# Patient Record
Sex: Male | Born: 1970 | Race: Black or African American | Hispanic: No | Smoking: Former smoker
Health system: Southern US, Community
[De-identification: ages and names within clinical notes are randomized; demographics above are authoritative.]

---

## 1998-06-17 ENCOUNTER — Emergency Department (HOSPITAL_COMMUNITY): Admission: EM | Admit: 1998-06-17 | Discharge: 1998-06-17 | Payer: Self-pay | Admitting: Emergency Medicine

## 2002-07-05 ENCOUNTER — Encounter: Admission: RE | Admit: 2002-07-05 | Discharge: 2002-07-05 | Payer: Self-pay | Admitting: Family Medicine

## 2002-07-05 ENCOUNTER — Encounter: Payer: Self-pay | Admitting: Family Medicine

## 2003-02-10 ENCOUNTER — Encounter: Payer: Self-pay | Admitting: Family Medicine

## 2003-02-10 ENCOUNTER — Encounter: Admission: RE | Admit: 2003-02-10 | Discharge: 2003-02-10 | Payer: Self-pay | Admitting: Family Medicine

## 2003-04-21 ENCOUNTER — Emergency Department (HOSPITAL_COMMUNITY): Admission: AD | Admit: 2003-04-21 | Discharge: 2003-04-21 | Payer: Self-pay | Admitting: Family Medicine

## 2003-05-02 ENCOUNTER — Emergency Department (HOSPITAL_COMMUNITY): Admission: AD | Admit: 2003-05-02 | Discharge: 2003-05-02 | Payer: Self-pay | Admitting: Family Medicine

## 2003-11-11 ENCOUNTER — Encounter: Admission: RE | Admit: 2003-11-11 | Discharge: 2003-11-11 | Payer: Self-pay | Admitting: Family Medicine

## 2004-10-27 IMAGING — CR DG ANKLE COMPLETE 3+V*R*
3 series · 3 of 3 positions shown · non-contrast
Comparison: none

CLINICAL DATA: Ankle pain following fall three days ago. 
 RIGHT ANKLE, THREE VIEWS
 There is generalized soft tissue swelling surrounding the ankle and a probable ankle joint effusion. No acute fracture or dislocation is demonstrated. 
 IMPRESSION
 No evidence of acute fracture or dislocation.

[view not recorded (1 of 3)]
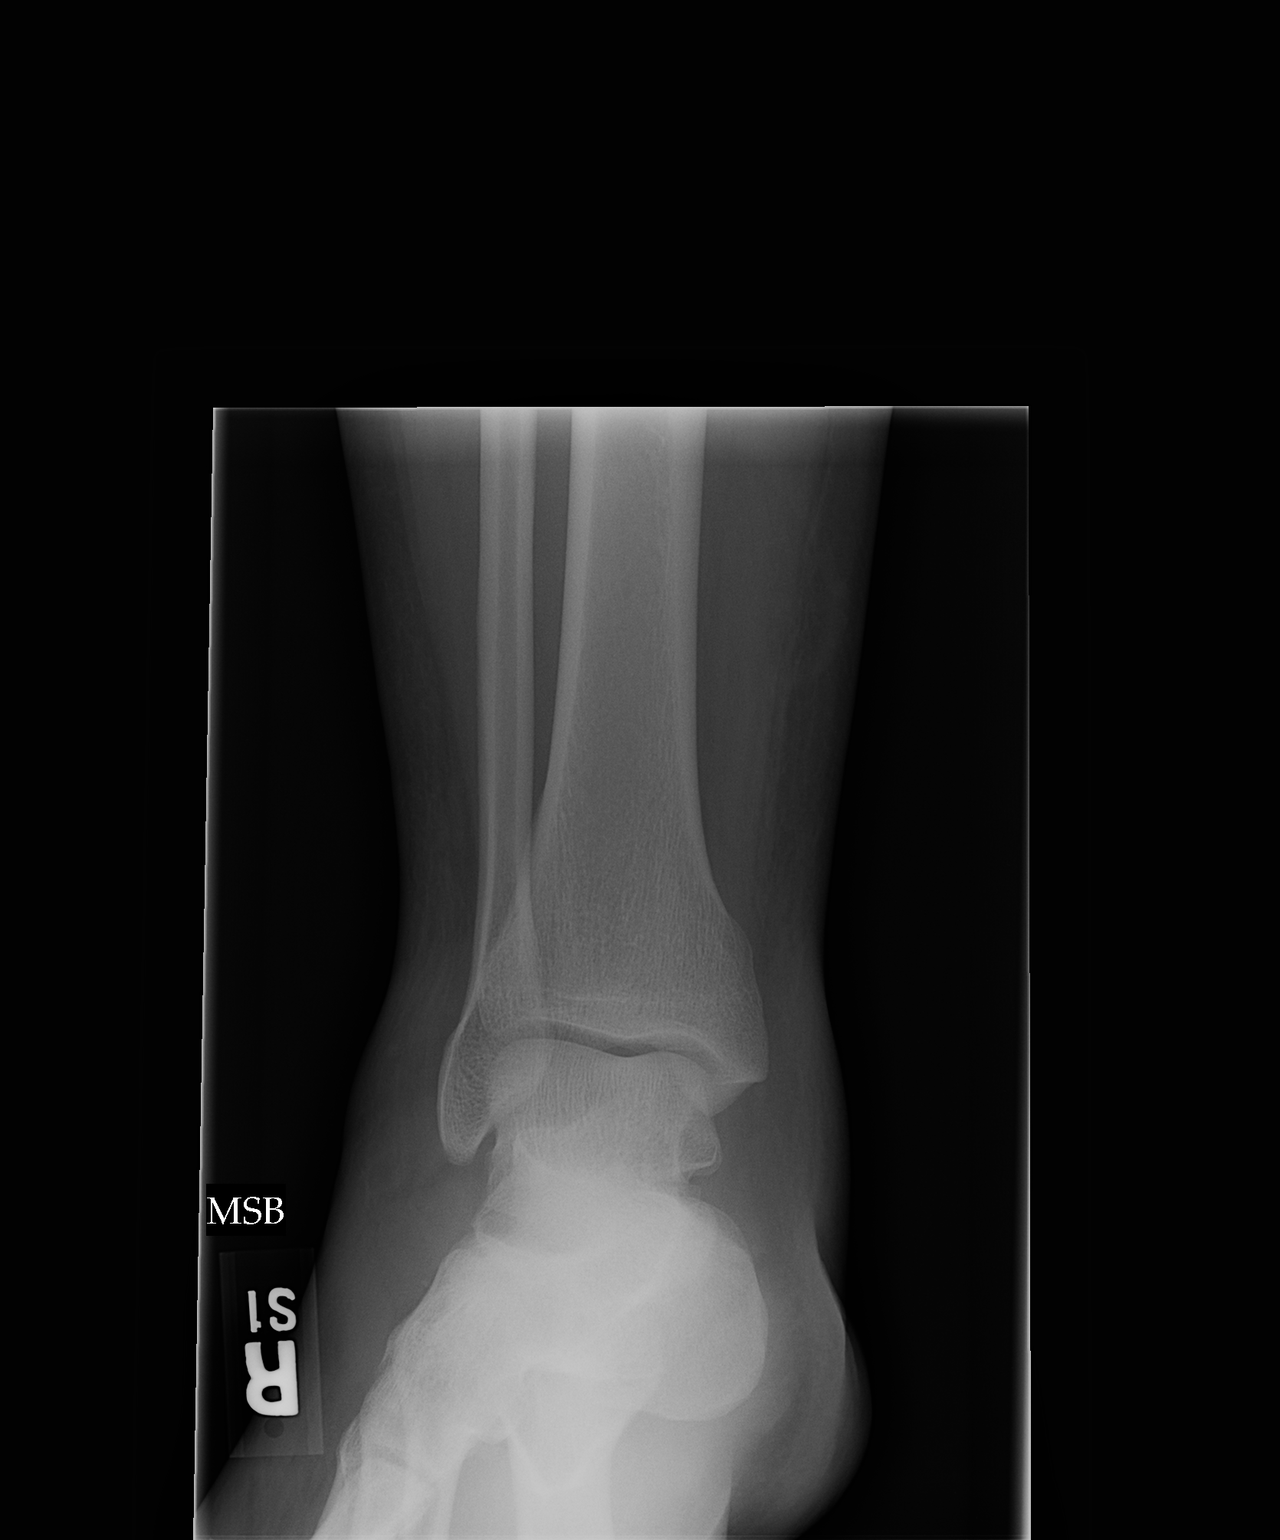

[view not recorded (2 of 3)]
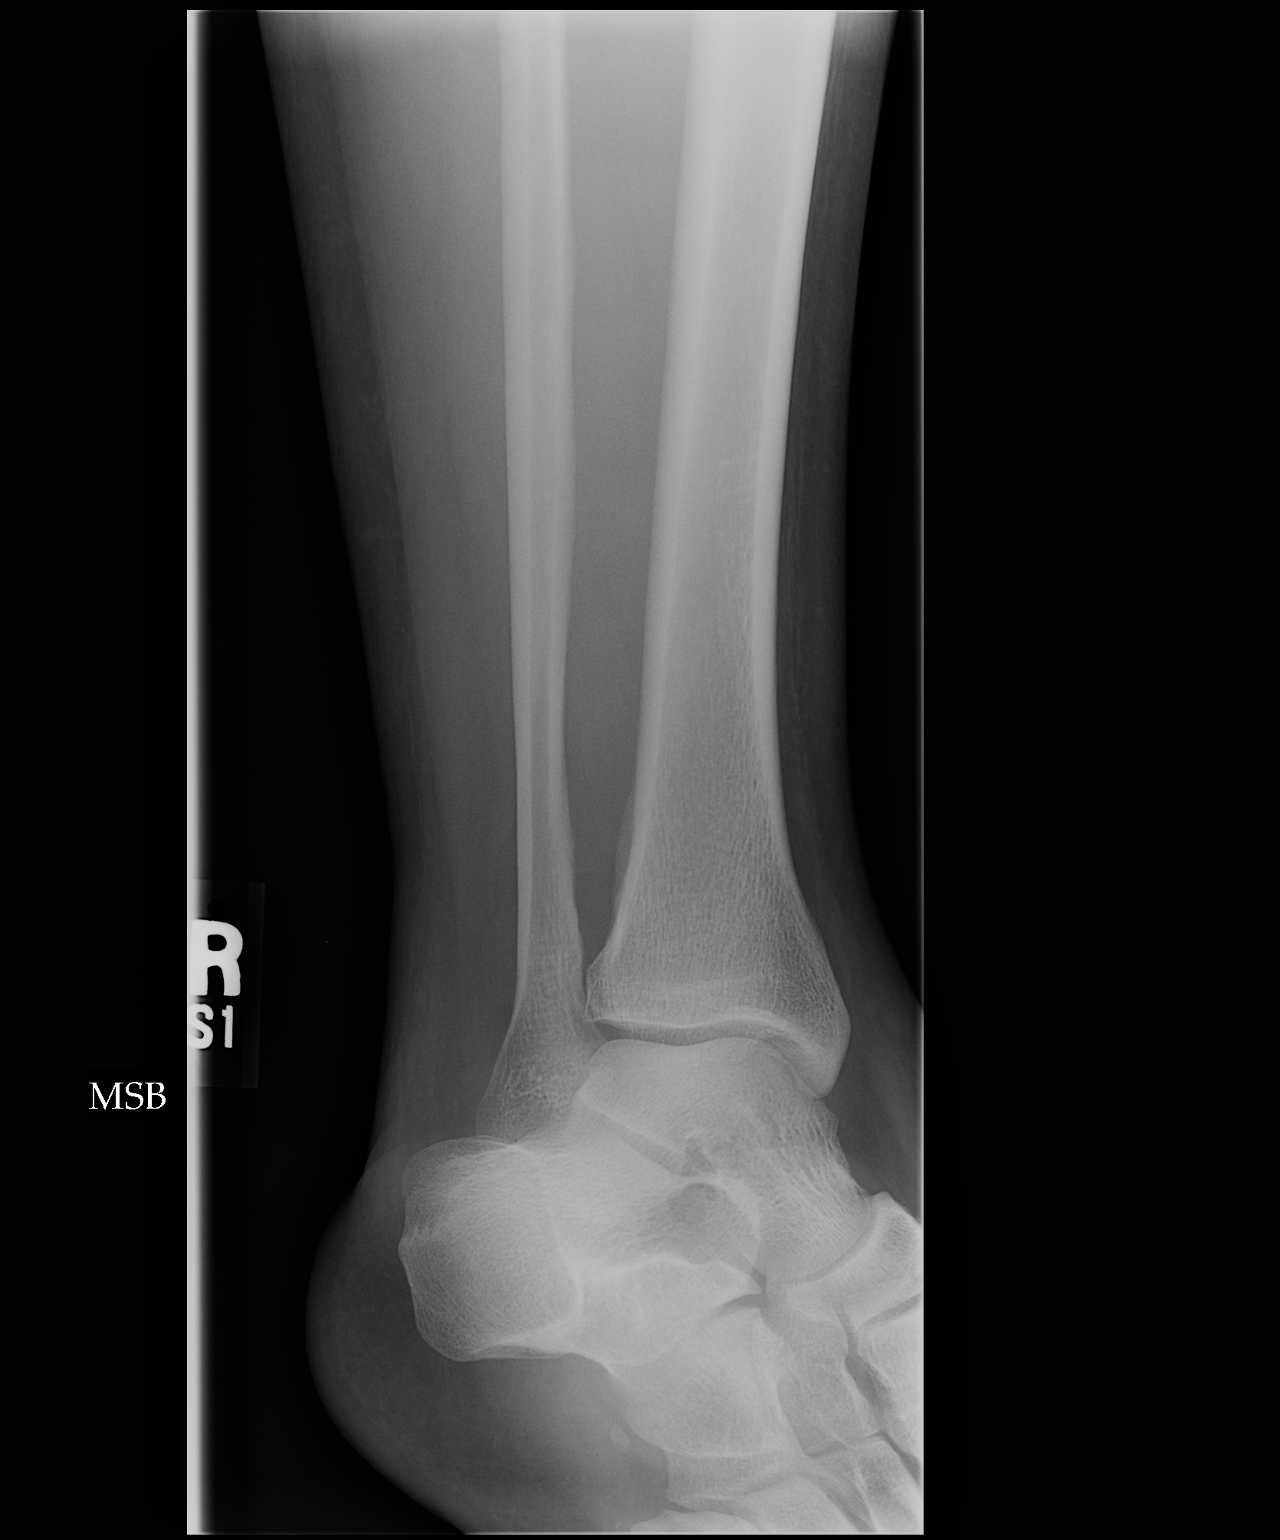

[view not recorded (3 of 3)]
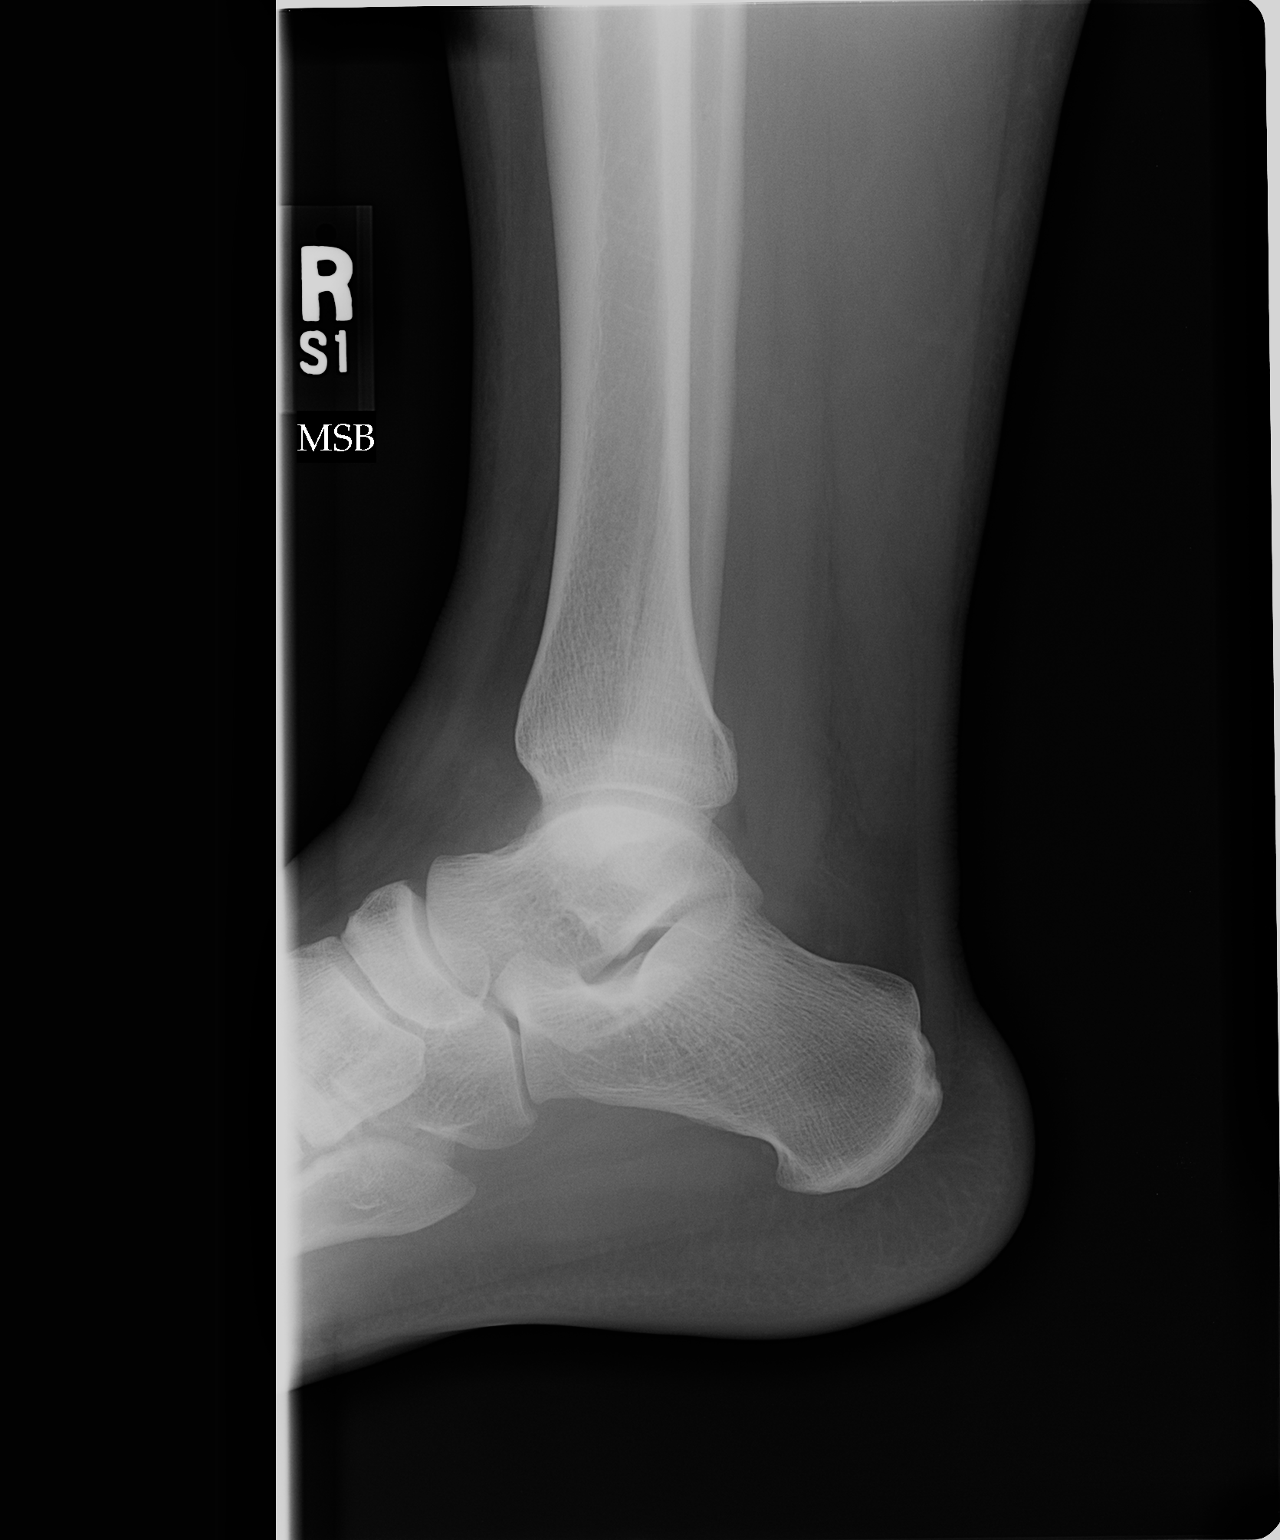

[3 of 3 positions shown; findings below may reference images not displayed]

## 2005-02-24 ENCOUNTER — Emergency Department (HOSPITAL_COMMUNITY): Admission: EM | Admit: 2005-02-24 | Discharge: 2005-02-24 | Payer: Self-pay | Admitting: Emergency Medicine

## 2005-04-01 ENCOUNTER — Emergency Department (HOSPITAL_COMMUNITY): Admission: EM | Admit: 2005-04-01 | Discharge: 2005-04-01 | Payer: Self-pay | Admitting: Emergency Medicine

## 2007-03-27 ENCOUNTER — Emergency Department (HOSPITAL_COMMUNITY): Admission: EM | Admit: 2007-03-27 | Discharge: 2007-03-27 | Payer: Self-pay | Admitting: Emergency Medicine

## 2008-05-08 ENCOUNTER — Emergency Department (HOSPITAL_COMMUNITY): Admission: EM | Admit: 2008-05-08 | Discharge: 2008-05-08 | Payer: Self-pay | Admitting: Emergency Medicine

## 2008-05-08 ENCOUNTER — Ambulatory Visit: Payer: Self-pay | Admitting: Family Medicine

## 2010-08-31 NOTE — H&P (Signed)
NAMEYASIR, Jerome Clark              ACCOUNT NO.:  [id number]   MEDICAL RECORD NO.:  [id number]          PATIENT TYPE:  EMS   LOCATION:  [facility]                         FACILITY:  [facility]   PHYSICIAN:  [name], MD        DATE OF BIRTH:  26-Nov-1970   DATE OF ADMISSION:  05/08/2008  DATE OF DISCHARGE:  05/08/2008                              HISTORY & PHYSICAL   PRIMARY CARE [name]:  [facility].   CHIEF COMPLAINT:  Influenza.   HISTORY OF PRESENT ILLNESS:  The patient is a 40 year old male with  history of tobacco abuse and obesity who presents with flu-like symptoms  x1 week.  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  The  patient has been had a fever at home to maximum of 102 degrees  Fahrenheit, nonproductive cough, chest soreness with cough, congestion  and rhinorrhea.  The patient also denies the history of COPD or asthma.  Denies nausea, vomiting, constipation or diarrhea.  More dyspnea on  exertion.  The patient does have some shortness of breath associated  with cough.  He was evaluated at Urgent Care today and had a pulse ox of  80-89% on room air and had some wheezing on exam.  He received 2  albuterol nebs which the patient says it helped some.  The patient had a  positive influenza A swab and has a chest x-ray concerning for  interstitial process.  Upon evaluation in emergency department, the  patient reports improvement in dyspnea.   PAST MEDICAL HISTORY:  Tobacco abuse and morbid obesity.   PAST SURGICAL HISTORY:  None.   MEDICATIONS:  Thera-Flu .   ALLERGIES:  No known drug allergies.   FAMILY HISTORY:  Father with diabetes mellitus.   SOCIAL HISTORY:  Positive for tobacco abuse, 1 pack per day times  approximately 10 years.   REVIEW OF SYSTEMS:  As per HPI otherwise review of system is negative.   PHYSICAL EXAMINATION:  VITAL SIGNS:  Temperature 100.9, heart rate 89,  blood pressure 120/70, respiratory rate 18, oxygen  saturation 98% on  room air.  GENERAL:  No apparent distress, awake, alert and oriented, laying in  bed.  HEENT:  Moist mucous membranes.  No lymphadenopathy.  Oropharynx without  exudate.  No nasal discharge.  Extraocular movements intact.  Pupils  equal, round and reactive to light and accommodation.  RESPIRATORY:  Expiratory wheezes, bilaterally normal breathing  withoutaccessory muscle use .  No rhonchi, or rales.  Good air  movements.  The patient is able to speak in full sentences.  CARDIOVASCULAR:  Regular rate and rhythm.  No murmurs, rubs, or gallops.  ABDOMEN:  Soft, nontender, and nondistended.  Positive bowel sounds.  EXTREMITIES:  No cyanosis or edema.  NEUROLOGIC:  Nonfocal.  Cranial nerves II through XII grossly intact.   LABORATORY DATA:  Positive influenza A swab at outside urgent care, CBC  at outside Urgent Care was white blood cell count 5.0, hemoglobin 15.2,  hematocrit 4.1, platelets 203.  Chest x-ray from outside reviewed and  significant for defined  infiltrate with well visualized heart borders.   ASSESSMENT AND PLAN:  The patient is a 40 year old male with history of  tobacco abuse here with URI symptoms and positive for influenza A swab.  1. Influenza A.  The patient is outside the time window for treatment      with oseltamivir and is not at high risk.  Not hypoxic or toxic      appearing at present.  Though the patient is okay with discharged      to home, she will continue with supportive measures at home.  We      will give treatment for cough suppression with Tessalon Perles 200      mg p.o. t.i.d. p.r.n. cough #60 without refills.  Encouraged rest      and fluids.  The patient is to remain out of work until afebrile 24      hours. The patient seen and discussed with Dr. Mauricio Po.  2. Tobacco abuse.  The patient encouraged to stop smoking.      Lauro Franklin, MD  Electronically Signed      [name], MD  Electronically Signed     TCB/MEDQ  D:  05/08/2008  T:  05/09/2008  Job:  [id number]

## 2011-01-24 LAB — DIFFERENTIAL
Basophils Relative: 0
Lymphocytes Relative: 18
Lymphs Abs: 1.5
Monocytes Absolute: 0.5
Neutrophils Relative %: 72

## 2011-01-24 LAB — I-STAT 8, (EC8 V) (CONVERTED LAB)
BUN: 13
Chloride: 105
HCT: 49
Hemoglobin: 16.7
Operator id: 198171
Sodium: 138
pCO2, Ven: 39.9 — ABNORMAL LOW

## 2011-01-24 LAB — COMPREHENSIVE METABOLIC PANEL
ALT: 20
AST: 22
Albumin: 3.6
Calcium: 8.9
GFR calc Af Amer: >60
Sodium: 137
Total Protein: 6.9

## 2011-01-24 LAB — URINALYSIS, ROUTINE W REFLEX MICROSCOPIC
Glucose, UA: NEGATIVE
Specific Gravity, Urine: 1.016
pH: 6.5

## 2011-01-24 LAB — CBC
MCV: 88.2
Platelets: 251
RDW: 13.5
WBC: 8.2

## 2011-01-24 LAB — URINE MICROSCOPIC-ADD ON

## 2011-03-12 ENCOUNTER — Emergency Department (INDEPENDENT_AMBULATORY_CARE_PROVIDER_SITE_OTHER)
Admission: EM | Admit: 2011-03-12 | Discharge: 2011-03-12 | Disposition: A | Discharge status: Home / Self Care | Payer: Self-pay | Source: Home / Self Care | Attending: Family Medicine | Admitting: Family Medicine

## 2011-03-12 ENCOUNTER — Emergency Department (INDEPENDENT_AMBULATORY_CARE_PROVIDER_SITE_OTHER): Payer: Self-pay

## 2011-03-12 ENCOUNTER — Encounter: Payer: Self-pay | Admitting: *Deleted

## 2011-03-12 DIAGNOSIS — J209 Acute bronchitis, unspecified: Secondary | ICD-10-CM

## 2011-03-12 MED ORDER — IBUPROFEN 800 MG PO TABS
800.0000 mg | ORAL_TABLET | Freq: Once | ORAL | Status: AC
  Administered 2011-03-12: 800 mg via ORAL

## 2011-03-12 MED ORDER — CEFUROXIME AXETIL 250 MG PO TABS: 250.0000 mg | ORAL_TABLET | Freq: Two times a day (BID) | ORAL | Status: AC

## 2011-03-12 MED ORDER — IBUPROFEN 800 MG PO TABS
ORAL_TABLET | ORAL | Status: AC
  Filled 2011-03-12: qty 1

## 2011-03-12 NOTE — ED Provider Notes (Signed)
History     CSN: [id number] Arrival date & time: 03/12/2011  6:27 PM   First MD Initiated Contact with Patient 03/12/11 1757      No chief complaint on file.   (Consider location/radiation/quality/duration/timing/severity/associated sxs/prior treatment) Patient is a 40 y.o. male presenting with fever, diarrhea, and cough. The history is provided by the patient.  Fever Primary symptoms of the febrile illness include fever, cough, abdominal pain and diarrhea. Primary symptoms do not include nausea or vomiting. The current episode started 2 days ago. This is a new problem. The problem has been gradually worsening.  Risk factors: smoker. Diarrhea The primary symptoms include fever, abdominal pain and diarrhea. Primary symptoms do not include nausea or vomiting.  Cough Associated symptoms include rhinorrhea.    No past medical history on file.  No past surgical history on file.  No family history on file.  History  Substance Use Topics  . Smoking status: Not on file  . Smokeless tobacco: Not on file  . Alcohol Use: Not on file      Review of Systems  Constitutional: Positive for fever.  HENT: Positive for congestion, rhinorrhea and postnasal drip.   Eyes: Negative.   Respiratory: Positive for cough.   Cardiovascular: Negative.   Gastrointestinal: Positive for abdominal pain and diarrhea. Negative for nausea and vomiting.  Genitourinary: Negative.   Skin: Negative.     Allergies  Review of patient's allergies indicates not on file.  Home Medications  No current outpatient prescriptions on file.  BP 124/82  Pulse 117  Temp(Src) 102.7 F (39.3 C) (Oral)  Resp 22  SpO2 96%  Physical Exam  Nursing note and vitals reviewed. Constitutional: He appears well-developed and well-nourished.  HENT:  Head: Normocephalic.  Right Ear: External ear normal.  Left Ear: External ear normal.  Nose: Nose normal.  Mouth/Throat: Oropharynx is clear and moist.  Eyes:  Conjunctivae and EOM are normal. Pupils are equal, round, and reactive to light.  Neck: Normal range of motion. Neck supple.  Cardiovascular: Normal rate, regular rhythm, normal heart sounds and intact distal pulses.   Pulmonary/Chest: Effort normal. He has wheezes in the right middle field. He has rales in the right middle field.  Abdominal: Soft. Bowel sounds are normal. He exhibits no distension and no mass. There is no tenderness. There is no rebound and no guarding.  Lymphadenopathy:    He has no cervical adenopathy.    ED Course  Procedures (including critical care time)  Labs Reviewed - No data to display No results found.   No diagnosis found.    MDM  X-rays reviewed and report per radiologist.         Barkley Bruns, MD 03/12/11 [phone]

## 2011-06-20 ENCOUNTER — Emergency Department (INDEPENDENT_AMBULATORY_CARE_PROVIDER_SITE_OTHER)
Admission: EM | Admit: 2011-06-20 | Discharge: 2011-06-20 | Disposition: A | Discharge status: Home / Self Care | Payer: Self-pay | Source: Home / Self Care

## 2011-06-20 ENCOUNTER — Encounter (HOSPITAL_COMMUNITY): Payer: Self-pay | Admitting: *Deleted

## 2011-06-20 DIAGNOSIS — R319 Hematuria, unspecified: Secondary | ICD-10-CM

## 2011-06-20 LAB — POCT URINALYSIS DIP (DEVICE)
Ketones, ur: NEGATIVE mg/dL
Protein, ur: NEGATIVE mg/dL
Specific Gravity, Urine: 1.02 (ref 1.005–1.030)
pH: 6 (ref 5.0–8.0)

## 2011-06-20 NOTE — ED Provider Notes (Signed)
Jerome Clark is a 41 y.o. male who presents to Urgent Care today for microscopic hematuria.  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 He was asked to followup with his primary care doctor for further evaluation. He feels well without any swelling fever chills dysuria or abdominal pain, back pain or trouble breathing.  He is not have insurance or a primary care doctor.   PMH reviewed. Obesity ROS as above otherwise neg Medications reviewed. No current facility-administered medications for this encounter.   Current Outpatient Prescriptions  Medication Sig Dispense Refill  . DM-Phenylephrine-Acetaminophen (TYLENOL COLD MULTI-SYMPTOM DAY PO) Take by mouth.          Exam:  BP 123/63  Pulse 86  Temp(Src) 98.7 F (37.1 C) (Oral)  Resp 16  SpO2 99% Gen: Well NAD Lungs: CTABL Nl WOB Heart: RRR no MRG Abd: NABS, NT, ND Exts: Non edematous BL  LE, warm and well perfused.   Urinalysis shows moderate blood and negative protein  Assessment and Plan: 42 year old male with microscopic hematuria.  Unsure of the diagnosis at this time. I doubt serious etiologies such as nephritic syndrome,  kidney stone, or infection as he feels well.  Nephrotic syndrome unlikely given no protein swelling or hypertension.  Plan to refer to urology for further evaluation. Additionally recommended that he establish with primary care doctor. I have provided him with information on the orange card and how to get a primary care doctor in this area.  He will followup.  Additionally discussed warning signs or symptoms.    Clementeen Graham, MD 06/20/11 [phone]

## 2011-06-20 NOTE — Discharge Instructions (Signed)
Thank you for coming in today. Please call [phone] to get a list of doctors in the area.  Please see Rudell Cobb to qualify for reduced or free medical services within the [facility].  Call her at [phone] today. Please try to be seen a the urologist's office listed above.  If you feel bad, trouble breathing, fever, chills please come back or go to the ER.

## 2011-06-20 NOTE — ED Notes (Signed)
C/O hematuria today.  Denies any other sxs.

## 2011-06-21 NOTE — ED Provider Notes (Signed)
Medical screening examination/treatment/procedure(s) were performed by resident physician or non-physician practitioner and as supervising physician I was immediately available for consultation/collaboration.   Barkley Bruns MD.    Barkley Bruns, MD 06/21/11 [phone]

## 2014-05-18 ENCOUNTER — Emergency Department (HOSPITAL_COMMUNITY)
Admission: EM | Admit: 2014-05-18 | Discharge: 2014-05-19 | Disposition: A | Discharge status: Home / Self Care | Payer: Self-pay | Attending: Emergency Medicine | Admitting: Emergency Medicine

## 2014-05-18 ENCOUNTER — Encounter (HOSPITAL_COMMUNITY): Payer: Self-pay | Admitting: *Deleted

## 2014-05-18 DIAGNOSIS — R0602 Shortness of breath: Secondary | ICD-10-CM | POA: Insufficient documentation

## 2014-05-18 DIAGNOSIS — R1012 Left upper quadrant pain: Secondary | ICD-10-CM | POA: Insufficient documentation

## 2014-05-18 DIAGNOSIS — R0989 Other specified symptoms and signs involving the circulatory and respiratory systems: Secondary | ICD-10-CM | POA: Insufficient documentation

## 2014-05-18 DIAGNOSIS — R0981 Nasal congestion: Secondary | ICD-10-CM | POA: Insufficient documentation

## 2014-05-18 DIAGNOSIS — Z720 Tobacco use: Secondary | ICD-10-CM | POA: Insufficient documentation

## 2014-05-18 DIAGNOSIS — R05 Cough: Secondary | ICD-10-CM | POA: Insufficient documentation

## 2014-05-18 NOTE — ED Provider Notes (Signed)
CSN: [id number]     Arrival date & time 05/18/14  2326 History   First MD Initiated Contact with Patient 05/18/14 2355     This chart was scribed for No att. providers found by Arlan OrganAshley Leger, ED Scribe. This patient was seen in room B16C/B16C and the patient's care was started 11:58 PM.   Chief Complaint  Patient presents with  . Multiple complaints    HPI  HPI Comments: Jerome Clark is a [age] y.o. male who presents to the Emergency Department complaining of shortness of breath x 1 hour. Patient states he woke with sensation of difficulty breathing. He also reports nasal congestion,  intermittent non productive cough, and mild abdominal pain. Pt states "my heart stopped" but did not check pulse. Symptoms have mildly improved since onset. No previous history of shortness of breath. He denies any leg swelling. No known allergies to medications. No fever or chills. No chest pain  History reviewed. No pertinent past medical history. History reviewed. No pertinent past surgical history. No family history on file. History  Substance Use Topics  . Smoking status: Current Every Day Smoker  . Smokeless tobacco: Never Used  . Alcohol Use: No    Review of Systems  Constitutional: Negative for fever and chills.  HENT: Positive for congestion and sinus pressure. Negative for sore throat.   Respiratory: Positive for cough and shortness of breath. Negative for wheezing.   Cardiovascular: Negative for chest pain, palpitations and leg swelling.  Gastrointestinal: Positive for abdominal pain. Negative for nausea and vomiting.  Musculoskeletal: Negative for back pain, neck pain and neck stiffness.  Skin: Negative for rash and wound.  Neurological: Negative for dizziness, weakness, light-headedness, numbness and headaches.  All other systems reviewed and are negative.     Allergies  Review of patient's allergies indicates no known allergies.  Home Medications   Prior to Admission medications    Not on File   Triage Vitals: BP 117/67 mmHg  Pulse 81  Temp(Src) 98.3 F (36.8 C) (Oral)  Resp 17  Ht 5\' 9"  (1.753 m)  Wt 370 lb (167.831 kg)  BMI 54.61 kg/m2  SpO2 96%   Physical Exam  Constitutional: He is oriented to person, place, and time. He appears well-developed and well-nourished. No distress.  Mildly anxious  HENT:  Head: Normocephalic and atraumatic.  Mouth/Throat: Oropharynx is clear and moist.  No sinus tenderness to percussion.  Eyes: EOM are normal. Pupils are equal, round, and reactive to light.  Neck: Normal range of motion. Neck supple. No JVD present.  Cardiovascular: Normal rate and regular rhythm.  Exam reveals no gallop and no friction rub.   No murmur heard. Pulmonary/Chest: Effort normal and breath sounds normal. No respiratory distress. He has no wheezes. He has no rales. He exhibits no tenderness.  Abdominal: Soft. Bowel sounds are normal. He exhibits no distension and no mass. There is tenderness (mild left upper quadrant tenderness with palpation. No rebound or guarding.). There is no rebound and no guarding.  Musculoskeletal: Normal range of motion. He exhibits no edema or tenderness.  No calf swelling or tenderness.  Neurological: He is alert and oriented to person, place, and time.  5/5 motor in all extremity. Sensation is grossly intact.  Skin: Skin is warm and dry. No rash noted. No erythema.  Psychiatric: He has a normal mood and affect. His behavior is normal.  Nursing note and vitals reviewed.   ED Course  Procedures (including critical care time)  DIAGNOSTIC STUDIES: Oxygen  Saturation is 95% on RA, adequate by my interpretation.    COORDINATION OF CARE: 4:45 AM-Discussed treatment plan with pt at bedside and pt agreed to plan.     Labs Review Labs Reviewed  COMPREHENSIVE METABOLIC PANEL - Abnormal; Notable for the following:    Glucose, Bld 101 (*)    Albumin 3.4 (*)    GFR calc non Af Amer 78 (*)    All other components within  normal limits  CBC WITH DIFFERENTIAL/PLATELET  BRAIN NATRIURETIC PEPTIDE  TROPONIN I  D-DIMER, QUANTITATIVE    Imaging Review No results found.   EKG Interpretation   Date/Time:  Monday May 19 2014 00:10:27 EST Ventricular Rate:  76 PR Interval:  148 QRS Duration: 80 QT Interval:  371 QTC Calculation: 417 R Axis:   60 Text Interpretation:  Sinus rhythm Nonspecific T abnrm, anterolateral  leads Confirmed by Ranae Palms  MD, Maame Dack ([id number]) on 05/19/2014 12:58:58 AM      MDM   Final diagnoses:  SOB (shortness of breath)  Pulmonary vascular congestion    I personally performed the services described in this documentation, which was scribed in my presence. The recorded information has been reviewed and is accurate.  Patient with mild hypoxia. Was given dose of Lasix with roughly 800 mL production of urine. Currently is having no shortness of breath. Ambulates without any hypoxia.  Patient advised to follow-up with wellness clinic. Will likely need sleep study. Return precautions have been given and the patient has voiced understanding.  Jerome Racer, MD 05/22/14 [phone]

## 2014-05-18 NOTE — ED Notes (Signed)
Patient presents stating he thinks his "heart stopped beating".  He woke up SOB and then vomited.  No breathing difficulty noted at this time.

## 2014-05-19 ENCOUNTER — Emergency Department (HOSPITAL_COMMUNITY): Payer: Self-pay

## 2014-05-19 LAB — CBC WITH DIFFERENTIAL/PLATELET
BASOS ABS: 0 10*3/uL (ref 0.0–0.1)
BASOS PCT: 0 % (ref 0–1)
EOS ABS: 0.4 10*3/uL (ref 0.0–0.7)
EOS PCT: 4 % (ref 0–5)
HCT: 42 % (ref 39.0–52.0)
Hemoglobin: 14.2 g/dL (ref 13.0–17.0)
LYMPHS PCT: 29 % (ref 12–46)
Lymphs Abs: 2.4 10*3/uL (ref 0.7–4.0)
MCH: 30.9 pg (ref 26.0–34.0)
MCHC: 33.8 g/dL (ref 30.0–36.0)
MCV: 91.5 fL (ref 78.0–100.0)
MONO ABS: 0.5 10*3/uL (ref 0.1–1.0)
Monocytes Relative: 6 % (ref 3–12)
NEUTROS ABS: 5 10*3/uL (ref 1.7–7.7)
Neutrophils Relative %: 61 % (ref 43–77)
PLATELETS: 209 10*3/uL (ref 150–400)
RBC: 4.59 MIL/uL (ref 4.22–5.81)
RDW: 13.4 % (ref 11.5–15.5)
WBC: 8.3 10*3/uL (ref 4.0–10.5)

## 2014-05-19 LAB — COMPREHENSIVE METABOLIC PANEL
ALT: 19 U/L (ref 0–53)
AST: 23 U/L (ref 0–37)
Albumin: 3.4 g/dL — ABNORMAL LOW (ref 3.5–5.2)
Alkaline Phosphatase: 69 U/L (ref 39–117)
Anion gap: 5 (ref 5–15)
BUN: 11 mg/dL (ref 6–23)
CO2: 31 mmol/L (ref 19–32)
Calcium: 9.1 mg/dL (ref 8.4–10.5)
Chloride: 105 mmol/L (ref 96–112)
Creatinine, Ser: 1.14 mg/dL (ref 0.50–1.35)
GFR calc Af Amer: >90 mL/min (ref >90)
GFR, EST NON AFRICAN AMERICAN: 78 mL/min — AB (ref >90)
GLUCOSE: 101 mg/dL — AB (ref 70–99)
Potassium: 3.9 mmol/L (ref 3.5–5.1)
SODIUM: 141 mmol/L (ref 135–145)
TOTAL PROTEIN: 6.5 g/dL (ref 6.0–8.3)
Total Bilirubin: 0.5 mg/dL (ref 0.3–1.2)

## 2014-05-19 LAB — D-DIMER, QUANTITATIVE: D-Dimer, Quant: 0.4 ug/mL-FEU (ref 0.00–0.48)

## 2014-05-19 LAB — BRAIN NATRIURETIC PEPTIDE: B Natriuretic Peptide: 52.4 pg/mL (ref 0.0–100.0)

## 2014-05-19 LAB — TROPONIN I

## 2014-05-19 MED ORDER — FUROSEMIDE 10 MG/ML IJ SOLN
40.0000 mg | Freq: Once | INTRAMUSCULAR | Status: AC
  Administered 2014-05-19: 40 mg via INTRAVENOUS
  Filled 2014-05-19: qty 4

## 2014-05-19 NOTE — Discharge Instructions (Signed)
Pulmonary Edema °Pulmonary edema is abnormal fluid buildup in the lungs that can make it hard to breathe. °HOME CARE °· Talk to your doctor about an exercise program. °· Eat a healthy diet: °¨ Eat fresh fruits, vegetables, and lean meats. °¨ Limit high fat and salty foods. °¨ Avoid processed, canned, or fried foods. °¨ Avoid fast food. °· Follow your doctor's advice about taking medicine and recording the medicine you take. °· Follow your doctor's advice about keeping a record of your weight. °· Talk to your doctor about keeping track of your blood pressure. °· Do not smoke. °· Do not use nicotine patches or nicotine gum. °· Make a follow-up appointment with your doctor. °· Ask your doctor for a copy of your latest heart tracing (ECG) and keep a copy with you at all times. °GET HELP RIGHT AWAY IF:  °· You have chest pain. THIS IS AN EMERGENCY. Do not wait to see if the pain will go away. Call for local emergency medical help. Do not drive yourself to the hospital. °· You have sweating, feel sick to your stomach (nauseous), or are experiencing shortness of breath. °· Your weight increases more than your doctor tells you it should. °· You start to have shortness of breath. °· You notice more swelling in your hands, feet, ankles, or belly. °· You have dizziness, blurred vision, headache, or unsteadiness that does not go away. °· You cough up bloody spit. °· You have a cough that does not go away. °· You are unable to sleep because it is hard to breathe. °· You begin to feel a "jumping" or "fluttering" sensation (palpitations) in the chest that is unusual for you. °MAKE SURE YOU:  °· Understand these instructions. °· Will watch your condition. °· Will get help right away if you are not doing well or get worse. °Document Released: 03/23/2009 Document Revised: 04/09/2013 Document Reviewed: 12/10/2012 °ExitCare® Patient Information ©2015 [employer]. This information is not intended to replace advice given to you by your  health care provider. Make sure you discuss any questions you have with your health care provider. ° °

## 2017-02-20 ENCOUNTER — Encounter (HOSPITAL_COMMUNITY): Payer: Self-pay | Admitting: Emergency Medicine

## 2017-02-20 ENCOUNTER — Ambulatory Visit (HOSPITAL_COMMUNITY)
Admission: EM | Admit: 2017-02-20 | Discharge: 2017-02-20 | Disposition: A | Discharge status: Home / Self Care | Payer: BLUE CROSS/BLUE SHIELD | Attending: Emergency Medicine | Admitting: Emergency Medicine

## 2017-02-20 DIAGNOSIS — J069 Acute upper respiratory infection, unspecified: Secondary | ICD-10-CM

## 2017-02-20 DIAGNOSIS — J029 Acute pharyngitis, unspecified: Secondary | ICD-10-CM

## 2017-02-20 NOTE — ED Triage Notes (Signed)
Pt has been suffering from nasal congestion, cough and sore throat for two weeks.  His son has had the same symptoms as well, and has a Strep Culture pending.

## 2017-02-20 NOTE — ED Provider Notes (Signed)
[facility]    CSN: [id number] Arrival date & time: 02/20/17  1446     History   Chief Complaint Chief Complaint  Patient presents with  . URI    HPI Jerome Clark is a 46 y.o. male.   46 year old obese male presents to the urgent care with complaints of sore throat started about 5 days ago and PND. Taking some type of OTC medications without relief. He thought he may have had a fever but undocumented.      History reviewed. No pertinent past medical history.  There are no active problems to display for this patient.   History reviewed. No pertinent surgical history.     Home Medications    Prior to Admission medications   Not on File    Family History History reviewed. No pertinent family history.  Social History Social History   Tobacco Use  . Smoking status: Former Smoker    Types: Cigarettes    Last attempt to quit: 11/20/2016    Years since quitting: 0.2  . Smokeless tobacco: Never Used  Substance Use Topics  . Alcohol use: No  . Drug use: No     Allergies   Patient has no known allergies.   Review of Systems Review of Systems  Constitutional: Negative for activity change, diaphoresis, fatigue and fever.  HENT: Positive for congestion, postnasal drip, sore throat and trouble swallowing. Negative for ear pain and facial swelling.   Eyes: Negative for pain, discharge and redness.  Respiratory: Positive for cough. Negative for chest tightness and shortness of breath.   Cardiovascular: Negative.   Gastrointestinal: Negative.   Musculoskeletal: Negative.  Negative for neck pain and neck stiffness.  Neurological: Negative.  Negative for tremors.  All other systems reviewed and are negative.    Physical Exam Triage Vital Signs ED Triage Vitals  Enc Vitals Group     BP 02/20/17 1556 131/75     Pulse Rate 02/20/17 1556 70     Resp --      Temp 02/20/17 1556 98.7 F (37.1 C)     Temp Source 02/20/17 1556 Oral     SpO2  02/20/17 1556 97 %     Weight --      Height --      Head Circumference --      Peak Flow --      Pain Score 02/20/17 1557 6     Pain Loc --      Pain Edu? --      Excl. in GC? --    No data found.  Updated Vital Signs BP 131/75 (BP Location: Left Wrist)   Pulse 70   Temp 98.7 F (37.1 C) (Oral)   SpO2 97%   Visual Acuity Right Eye Distance:   Left Eye Distance:   Bilateral Distance:    Right Eye Near:   Left Eye Near:    Bilateral Near:     Physical Exam  Constitutional: He is oriented to person, place, and time. He appears well-developed and well-nourished. No distress.  HENT:  Oropharynx with mild erythema. Copious amount of frothy PND. No exudate  Eyes: EOM are normal.  Neck: Normal range of motion. Neck supple.  Cardiovascular: Normal rate and regular rhythm.  Pulmonary/Chest: Effort normal and breath sounds normal. No respiratory distress.  Musculoskeletal: Normal range of motion. He exhibits no edema.  Lymphadenopathy:    He has no cervical adenopathy.  Neurological: He is alert and oriented to person, place,  and time.  Skin: Skin is warm and dry. No rash noted.  Psychiatric: He has a normal mood and affect.  Nursing note and vitals reviewed.    UC Treatments / Results  Labs (all labs ordered are listed, but only abnormal results are displayed) Labs Reviewed - No data to display  EKG  EKG Interpretation None       Radiology No results found.  Procedures Procedures (including critical care time)  Medications Ordered in UC Medications - No data to display   Initial Impression / Assessment and Plan / UC Course  I have reviewed the triage vital signs and the nursing notes.  Pertinent labs & imaging results that were available during my care of the patient were reviewed by me and considered in my medical decision making (see chart for details).    Much of the discomfort in your throat is due to drainage in the back of his throat. This causes  coughing and sore throat. He likely had a viral type sore throat. Ibuprofen 600-800 mg every 6-8 hours. Tylenol every 4 hours. Recommend taking Allegra or Zyrtec daily to help with drainage and if needed can take something stronger such as chlorpheniramine 4 mg every 4 hours. This can cause drowsiness.     Final Clinical Impressions(s) / UC Diagnoses   Final diagnoses:  Acute upper respiratory infection  Sore throat    New Prescriptions This SmartLink is deprecated. Use AVSMEDLIST instead to display the medication list for a patient.   Controlled Substance Prescriptions [place] Controlled Substance Registry consulted? Not Applicable   Hayden RasmussenMabe, Gurfateh Mcclain, NP 02/20/17 [phone]

## 2017-02-20 NOTE — Discharge Instructions (Signed)
Much of the discomfort in your throat is due to drainage in the back of his throat. This causes coughing and sore throat. He likely had a viral type sore throat. Ibuprofen 600-800 mg every 6-8 hours. Tylenol every 4 hours. Recommend taking Allegra or Zyrtec daily to help with drainage and if needed can take something stronger such as chlorpheniramine 4 mg every 4 hours. This can cause drowsiness.
